# Patient Record
Sex: Female | Born: 2000 | Race: White | Hispanic: No | Marital: Single
Health system: Southern US, Community
[De-identification: ages and names within clinical notes are randomized; demographics above are authoritative.]

---
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
## 2023-04-13 ENCOUNTER — Encounter: Payer: Self-pay | Admitting: Emergency Medicine

## 2023-04-13 ENCOUNTER — Emergency Department
Admission: EM | Admit: 2023-04-13 | Discharge: 2023-04-13 | Disposition: A | Discharge status: Home / Self Care | Payer: Medicaid Other | Attending: Student in an Organized Health Care Education/Training Program | Admitting: Student in an Organized Health Care Education/Training Program

## 2023-04-13 ENCOUNTER — Other Ambulatory Visit: Payer: Self-pay

## 2023-04-13 DIAGNOSIS — S61052A Open bite of left thumb without damage to nail, initial encounter: Secondary | ICD-10-CM | POA: Insufficient documentation

## 2023-04-13 DIAGNOSIS — Z23 Encounter for immunization: Secondary | ICD-10-CM | POA: Insufficient documentation

## 2023-04-13 DIAGNOSIS — E039 Hypothyroidism, unspecified: Secondary | ICD-10-CM | POA: Diagnosis not present

## 2023-04-13 DIAGNOSIS — W5311XA Bitten by rat, initial encounter: Secondary | ICD-10-CM | POA: Diagnosis not present

## 2023-04-13 DIAGNOSIS — S61451A Open bite of right hand, initial encounter: Secondary | ICD-10-CM | POA: Diagnosis present

## 2023-04-13 MED ORDER — AMOXICILLIN 500 MG PO TABS: 500.0000 mg | ORAL_TABLET | Freq: Three times a day (TID) | ORAL | 0 refills | Status: AC

## 2023-04-13 MED ORDER — BACITRACIN ZINC 500 UNIT/GM EX OINT
TOPICAL_OINTMENT | Freq: Once | CUTANEOUS | Status: AC
  Administered 2023-04-13: 1 via TOPICAL
  Filled 2023-04-13: qty 0.9

## 2023-04-13 MED ORDER — TETANUS-DIPHTH-ACELL PERTUSSIS 5-2.5-18.5 LF-MCG/0.5 IM SUSY
0.5000 mL | PREFILLED_SYRINGE | Freq: Once | INTRAMUSCULAR | Status: AC
  Administered 2023-04-13: 0.5 mL via INTRAMUSCULAR
  Filled 2023-04-13: qty 0.5

## 2023-04-13 MED ORDER — AMOXICILLIN 500 MG PO CAPS
500.0000 mg | ORAL_CAPSULE | Freq: Once | ORAL | Status: AC
  Administered 2023-04-13: 500 mg via ORAL
  Filled 2023-04-13: qty 1

## 2023-04-13 NOTE — ED Notes (Signed)
Pt discharged by paramedic [facility]

## 2023-04-13 NOTE — ED Provider Notes (Signed)
[facility] Emergency Department Provider Note     Event Date/Time   First MD Initiated Contact with Patient 04/13/23 1807     (approximate)   History   Animal Bite   HPI  Joan Perry is a 22 y.o. female with a history of tachycardia, hypothyroidism, and generalized anxiety disorder, presents to the ED for evaluation of a rat bite injury to her right hand and left thumb.  Patient presents for wounds dressed with bandages and bleeding currently controlled.  She along with her brother and sister-in-law present in the room.  They report being tenets in a rat infested home.  Patient would describe the contact happen just prior to arrival when a rat was in her nightstand, she opened the drawer and the rat bit her on hand.  She is presenting for wound infection prophylaxis, and voices concern for rabies exposure.  Physical Exam   Triage Vital Signs: ED Triage Vitals  Encounter Vitals Group     BP 04/13/23 1715 (!) 132/92     Systolic BP Percentile --      Diastolic BP Percentile --      Pulse Rate 04/13/23 1715 (!) 133     Resp 04/13/23 1715 18     Temp 04/13/23 1715 97.7 F (36.5 C)     Temp Source 04/13/23 1715 Oral     SpO2 04/13/23 1715 100 %     Weight 04/13/23 1712 211 lb (95.7 kg)     Height 04/13/23 1712 4\' 11"  (1.499 m)     Head Circumference --      Peak Flow --      Pain Score 04/13/23 1712 2     Pain Loc --      Pain Education --      Exclude from Growth Chart --     Most recent vital signs: Vitals:   04/13/23 1715  BP: (!) 132/92  Pulse: (!) 133  Resp: 18  Temp: 97.7 F (36.5 C)  SpO2: 100%    General Awake, no distress. NAD HEENT NCAT. PERRL. EOMI. No rhinorrhea. Mucous membranes are moist.  CV:  Good peripheral perfusion.  RESP:  Normal effort.  ABD:  No distention.  MSK:  Normal composite fist bilaterally.  Left thumb with small, superficial wounds consistent with reported animal bite.  No active bleeding at this  time.  Bloody scabs noted to wounds.  Similar finding to the dorsal aspect of the right hand at the thenar prominence.  No active bleeding at this time.   ED Results / Procedures / Treatments   Labs (all labs ordered are listed, but only abnormal results are displayed) Labs Reviewed - No data to display   EKG  RADIOLOGY  No results found.   PROCEDURES:  Critical Care performed: No  Procedures   MEDICATIONS ORDERED IN ED: Medications  Tdap (BOOSTRIX) injection 0.5 mL (0.5 mLs Intramuscular Given 04/13/23 1846)  amoxicillin (AMOXIL) capsule 500 mg (500 mg Oral Given 04/13/23 1845)  bacitracin ointment (1 Application Topical Given 04/13/23 1846)     IMPRESSION / MDM / ASSESSMENT AND PLAN / ED COURSE  I reviewed the triage vital signs and the nursing notes.                              Differential diagnosis includes, but is not limited to, rat bite exposure, animal bite exposure, abrasions, lacerations  Patient's presentation is most  consistent with acute, uncomplicated illness.  Patient's diagnosis is consistent with animal bite exposure secondary to reported rat incident.  We discussed the relative risk of infection including discussions about rabies, rat bite fever, and rare infections like hanta virus and bubonic plague. We discussed that the rat bite does not warrant rabies prophy.  Patient will be discharged home with prescriptions for amoxicillin. Patient is to follow up with a local urgent care as needed or otherwise directed. Patient is given ED precautions to return to the ED for any worsening or new symptoms.   FINAL CLINICAL IMPRESSION(S) / ED DIAGNOSES   Final diagnoses:  Rat bite, initial encounter     Rx / DC Orders   ED Discharge Orders          Ordered    amoxicillin (AMOXIL) 500 MG tablet  3 times daily        04/13/23 1855             Note:  This document was prepared using Dragon voice recognition software and may include unintentional  dictation errors.    Lissa Hoard, PA-C 04/13/23 1918    Willy Eddy, MD 04/13/23 [phone]

## 2023-04-13 NOTE — Discharge Instructions (Addendum)
Keep the wounds clean, dry, and covered with Band-Aids as necessary.  Wash daily with soap and water as necessary take the antibiotics 3 times a day as directed until all pills are completed.  Follow-up with your primary provider or local urgent care for ongoing concerns.

## 2023-04-13 NOTE — ED Triage Notes (Signed)
Pt presents to the ED for a rat bite to the right hand and left thumb bleeding controlled. Pt asking for rabies and "something for parasytes"
